# Patient Record
Sex: Female | Born: 2000 | Race: White | Hispanic: No | Marital: Single | Smoking: Never smoker
Health system: Southern US, Community
[De-identification: ages and names within clinical notes are randomized; demographics above are authoritative.]

---
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
## 2019-02-18 ENCOUNTER — Other Ambulatory Visit: Payer: Self-pay | Admitting: Family Medicine

## 2019-02-20 NOTE — Telephone Encounter (Signed)
May have 6 months I recommend schedule a wellness checkup with Victoria Huynh later this year

## 2019-05-14 ENCOUNTER — Other Ambulatory Visit: Payer: Self-pay | Admitting: Family Medicine

## 2019-05-15 NOTE — Telephone Encounter (Signed)
Please contact pt to set up physical, then may route back to nurses. Thank you

## 2019-05-16 NOTE — Telephone Encounter (Signed)
lvm to call and schedule physical

## 2019-05-16 NOTE — Telephone Encounter (Signed)
Tried to call pt mailbox is full.

## 2019-05-17 NOTE — Telephone Encounter (Signed)
Scheduled 12/15 

## 2019-06-20 ENCOUNTER — Other Ambulatory Visit: Payer: Self-pay

## 2019-06-20 ENCOUNTER — Ambulatory Visit (INDEPENDENT_AMBULATORY_CARE_PROVIDER_SITE_OTHER): Payer: BC Managed Care – PPO | Admitting: Family Medicine

## 2019-06-20 VITALS — BP 106/72 | Temp 98.1°F | Ht 63.0 in | Wt 147.4 lb

## 2019-06-20 DIAGNOSIS — N946 Dysmenorrhea, unspecified: Secondary | ICD-10-CM

## 2019-06-20 DIAGNOSIS — Z0000 Encounter for general adult medical examination without abnormal findings: Secondary | ICD-10-CM

## 2019-06-20 NOTE — Progress Notes (Signed)
Subjective:    Patient ID: Victoria Huynh, female    DOB: [birth year], 18 y.o.   MRN: [id number]  HPI The patient comes in today for a wellness visit.  Patient does not smoke does not drink does not do drugs denies being depressed goes to school at [employer] thinking about pharmacy but is looking at the possibility of doing something different because organic chemistry very difficult  A review of their health history was completed.  A review of medications was also completed.  Any needed refills; patient would like to change BCP  Eating habits: could be better but not bad  Falls/  MVA accidents in past few months: none  Regular exercise: not too much except work  Barrister's clerk pt sees on regular basis: none  Preventative health issues were discussed.   Additional concerns: Patient would like to switch to different birth control pill. The current pill she has been on 1.5 years and has started having difficult periods again.    Review of Systems  Constitutional: Negative for activity change, appetite change and fatigue.  HENT: Negative for congestion and rhinorrhea.   Eyes: Negative for discharge.  Respiratory: Negative for cough, chest tightness and wheezing.   Cardiovascular: Negative for chest pain.  Gastrointestinal: Negative for abdominal pain, blood in stool and vomiting.  Endocrine: Negative for polyphagia.  Genitourinary: Negative for difficulty urinating and frequency.  Musculoskeletal: Negative for neck pain.  Skin: Negative for color change.  Allergic/Immunologic: Negative for environmental allergies and food allergies.  Neurological: Negative for weakness and headaches.  Psychiatric/Behavioral: Negative for agitation and behavioral problems.       Objective:   Physical Exam Constitutional:      Appearance: She is well-developed.  HENT:     Head: Normocephalic.     Right Ear: External ear normal.     Left Ear: External ear normal.  Eyes:   Pupils: Pupils are equal, round, and reactive to light.  Neck:     Thyroid: No thyromegaly.  Cardiovascular:     Rate and Rhythm: Normal rate and regular rhythm.     Heart sounds: Normal heart sounds. No murmur.  Pulmonary:     Effort: Pulmonary effort is normal. No respiratory distress.     Breath sounds: Normal breath sounds. No wheezing.  Abdominal:     General: Bowel sounds are normal. There is no distension.     Palpations: Abdomen is soft. There is no mass.     Tenderness: There is no abdominal tenderness.  Musculoskeletal:        General: No tenderness. Normal range of motion.     Cervical back: Normal range of motion.  Lymphadenopathy:     Cervical: No cervical adenopathy.  Skin:    General: Skin is warm and dry.  Neurological:     Mental Status: She is alert and oriented to person, place, and time.     Motor: No abnormal muscle tone.  Psychiatric:        Behavior: Behavior normal.     Healthy diet recommended to the patient safety recommendations as well      Assessment & Plan:  This young patient was seen today for a wellness exam. Significant time was spent discussing the following items: -Developmental status for age was reviewed.  -Safety measures appropriate for age were discussed. -Review of immunizations was completed. The appropriate immunizations were discussed and ordered. -Dietary recommendations and physical activity recommendations were made. -Gen. health recommendations were reviewed -Discussion of growth parameters  were also made with the family. -Questions regarding general health of the patient asked by the family were answered.  Patient defers on HPV vaccine patient defers on flu vaccine  Will discuss other birth control pills with Hoyle Sauer and then discussed with the patient  I did discuss the case with Chrys Racer regarding what pills may work best for this patient she recommended increasing the estrogen component So we will do Loestrin Fe  1.5/30, 1 pack, 12 refills Patient will give [place] feedback over the course of the next 3 months on how that is doing for her

## 2019-07-04 NOTE — Progress Notes (Signed)
Left message to return call- med pended await pharmacy

## 2019-07-04 NOTE — Addendum Note (Signed)
Addended by: Dairl Ponder on: 07/04/2019 11:54 AM   Modules accepted: Orders

## 2019-07-10 ENCOUNTER — Telehealth: Payer: Self-pay | Admitting: Family Medicine

## 2019-07-10 MED ORDER — NORETHIN ACE-ETH ESTRAD-FE 1.5-30 MG-MCG PO TABS: 1.0000 | ORAL_TABLET | Freq: Every day | ORAL | 12 refills | Status: DC

## 2019-07-10 NOTE — Progress Notes (Signed)
Pt contacted and verbalized understanding. Medication sent to pharmacy  

## 2019-07-10 NOTE — Addendum Note (Signed)
Addended by: Marlowe Shores on: 07/10/2019 11:15 AM   Modules accepted: Orders

## 2019-07-10 NOTE — Telephone Encounter (Signed)
Pt contacted and verbalized understanding. Medication sent to pharmacy  

## 2019-07-10 NOTE — Telephone Encounter (Signed)
Pt's mom calling checking to see if Dr. Lorin Picket is going to change pt's birth control. Pt had physical and Dr. Lorin Picket told her he would get with Eber Jones and see about making a change. She is almost out of medication and just wanting to follow up on that.

## 2019-08-06 ENCOUNTER — Other Ambulatory Visit: Payer: Self-pay | Admitting: Family Medicine

## 2020-06-10 ENCOUNTER — Other Ambulatory Visit: Payer: Self-pay | Admitting: Family Medicine

## 2020-06-10 NOTE — Telephone Encounter (Signed)
No answer

## 2020-06-13 NOTE — Telephone Encounter (Signed)
Has appointment for on 12/14

## 2020-06-13 NOTE — Telephone Encounter (Signed)
Patient schedule appointment for 12/14

## 2020-06-18 ENCOUNTER — Ambulatory Visit (INDEPENDENT_AMBULATORY_CARE_PROVIDER_SITE_OTHER): Payer: BC Managed Care – PPO | Admitting: Family Medicine

## 2020-06-18 ENCOUNTER — Encounter: Payer: Self-pay | Admitting: Family Medicine

## 2020-06-18 VITALS — BP 110/62 | HR 76 | Temp 97.2°F | Ht 63.0 in | Wt 125.6 lb

## 2020-06-18 DIAGNOSIS — Z3041 Encounter for surveillance of contraceptive pills: Secondary | ICD-10-CM

## 2020-06-18 MED ORDER — NORETHINDRONE ACET-ETHINYL EST 1.5-30 MG-MCG PO TABS: 1.0000 | ORAL_TABLET | Freq: Every day | ORAL | 11 refills | Status: DC

## 2020-06-18 NOTE — Patient Instructions (Signed)

## 2020-06-18 NOTE — Progress Notes (Signed)
   Patient ID: Victoria Huynh, female    DOB: [birth year], 19 y.o.   MRN: [id number]   Chief Complaint  Patient presents with  . Contraception    Follow up- needs refill of birth control- doing well on current birth control   Subjective:  CC: oral contraceptive refills   Presents today for oral contraceptive medication management.  She is due for her annual wellness, unable to make that appointment, will make that soon denies any issues with oral contraceptives, managing her menstrual cycle well with a shorter and lighter flow and manageable cramps.  Denies any fever, chills, chest pain, shortness of breath, abdominal pain, no leg pain.    Medical History Dejon has no past medical history on file.   Outpatient Encounter Medications as of 06/18/2020  Medication Sig  . [DISCONTINUED] LO LOESTRIN FE 1 MG-10 MCG / 10 MCG tablet TAKE 1 TABLET BY MOUTH EVERY DAY  . Norethindrone Acetate-Ethinyl Estradiol (JUNEL 1.5/30) 1.5-30 MG-MCG tablet Take 1 tablet by mouth daily.  . [DISCONTINUED] Norethindrone Acetate-Ethinyl Estradiol (JUNEL 1.5/30) 1.5-30 MG-MCG tablet Take 1 tablet by mouth daily.  . [DISCONTINUED] norethindrone-ethinyl estradiol-iron (LOESTRIN FE 1.5/30) 1.5-30 MG-MCG tablet Take 1 tablet by mouth daily.   No facility-administered encounter medications on file as of 06/18/2020.     Review of Systems  Constitutional: Negative for chills and fever.  HENT: Negative for ear pain.   Respiratory: Negative for shortness of breath.   Cardiovascular: Negative for chest pain.  Gastrointestinal: Negative for abdominal pain.     Vitals BP 110/62   Pulse 76   Temp (!) 97.2 F (36.2 C) (Oral)   Ht 5\' 3"  (1.6 m)   Wt 125 lb 9.6 oz (57 kg)   SpO2 100%   BMI 22.25 kg/m   Objective:   Physical Exam Vitals reviewed.  Constitutional:      Appearance: Normal appearance.  Cardiovascular:     Rate and Rhythm: Normal rate and regular rhythm.     Heart sounds: Normal heart sounds.   Pulmonary:     Effort: Pulmonary effort is normal.     Breath sounds: Normal breath sounds.  Abdominal:     General: Bowel sounds are normal.  Skin:    General: Skin is warm and dry.  Neurological:     General: No focal deficit present.     Mental Status: She is alert.  Psychiatric:        Behavior: Behavior normal.      Assessment and Plan   1. Oral contraceptive pill surveillance - Norethindrone Acetate-Ethinyl Estradiol (JUNEL 1.5/30) 1.5-30 MG-MCG tablet; Take 1 tablet by mouth daily.  Dispense: 28 tablet; Refill: 11   No problems with OCPs. Managing her menstrual cycle problems well. Menstrual cycles are shorter, with lighter flow, she has some cramping but is easily managed she is able to continue with her daily routine.  Denies any issues with leg pain.  Is not sexually active, has not missed any pills  .Agrees with plan of care discussed today. Understands warning signs to seek further care: Chest pain, shortness of breath, any lower leg pain. Understands to follow-up for wellness exam soon, will make that appointment on the way out today.  05-09-1978, FNP-C

## 2020-07-04 ENCOUNTER — Other Ambulatory Visit: Payer: Self-pay

## 2020-07-04 ENCOUNTER — Encounter: Payer: Self-pay | Admitting: Family Medicine

## 2020-07-04 ENCOUNTER — Ambulatory Visit (INDEPENDENT_AMBULATORY_CARE_PROVIDER_SITE_OTHER): Payer: BC Managed Care – PPO | Admitting: Family Medicine

## 2020-07-04 VITALS — BP 110/72 | HR 79 | Temp 98.1°F | Ht 64.0 in | Wt 126.0 lb

## 2020-07-04 DIAGNOSIS — T7840XA Allergy, unspecified, initial encounter: Secondary | ICD-10-CM

## 2020-07-04 DIAGNOSIS — Z0000 Encounter for general adult medical examination without abnormal findings: Secondary | ICD-10-CM

## 2020-07-04 MED ORDER — EPINEPHRINE 0.3 MG/0.3ML IJ SOAJ: 0.3000 mg | INTRAMUSCULAR | 2 refills | Status: DC | PRN

## 2020-07-04 NOTE — Progress Notes (Signed)
Patient ID: Victoria Huynh, female    DOB: 10-Jan-2001, 19 y.o.   MRN: [id number]   Chief Complaint  Patient presents with  . Annual Exam   Subjective:  CC: annual wellness  Presents today for annual wellness.  Was last seen in December for oral contraceptive management, presents today for her annual physical.  Reports that on December 23 she broke out in hives, she was at work, she works at [employer] is unsure of what caused this allergic reaction.  The hives went up her neck, down her back, and had profuse itching.  She did take Benadryl, which helped relieve the discomfort, hives were present for approximately 3 days.  She denies any other issues, no shortness of breath during the reaction.    The patient comes in today for a wellness visit.    A review of their health history was completed.  A review of medications was also completed.  Any needed refills; none  Eating habits: health conscious  Falls/  MVA accidents in past few months: none  Regular exercise: softball 2 -3 times a week  Specialist pt sees on regular basis: none  Preventative health issues were discussed.   Additional concerns: broke out in hives on back and neck on dec 23rd.    Medical History Victoria Huynh has no past medical history on file.   Outpatient Encounter Medications as of 07/04/2020  Medication Sig  . EPINEPHrine 0.3 mg/0.3 mL IJ SOAJ injection Inject 0.3 mg into the muscle as needed for anaphylaxis.  . Norethindrone Acetate-Ethinyl Estradiol (JUNEL 1.5/30) 1.5-30 MG-MCG tablet Take 1 tablet by mouth daily.   No facility-administered encounter medications on file as of 07/04/2020.     Review of Systems  Constitutional: Negative for chills, fatigue and fever.  HENT: Negative for congestion.   Respiratory: Negative for cough and shortness of breath.   Cardiovascular: Negative for chest pain, palpitations and leg swelling.  Gastrointestinal: Negative for abdominal pain, blood in stool,  diarrhea, nausea and vomiting.  Genitourinary: Negative for dysuria, menstrual problem, pelvic pain, vaginal bleeding and vaginal discharge.  Musculoskeletal: Negative for back pain, joint swelling and neck pain.  Skin: Negative for rash.       Hives and itching on Dec 23 (unknown cause).  Neurological: Negative for dizziness, weakness, light-headedness, numbness and headaches.  Hematological: Negative for adenopathy.  Psychiatric/Behavioral: Negative for self-injury and sleep disturbance. The patient is not nervous/anxious.      Vitals BP 110/72   Pulse 79   Temp 98.1 F (36.7 C)   Ht 5\' 4"  (1.626 m)   Wt 126 lb (57.2 kg)   LMP 06/13/2020 (Approximate)   SpO2 100%   BMI 21.63 kg/m   Objective:   Physical Exam Vitals reviewed.  Constitutional:      Appearance: Normal appearance.  HENT:     Right Ear: Tympanic membrane normal.     Left Ear: Tympanic membrane normal.     Nose: Nose normal.     Mouth/Throat:     Mouth: Mucous membranes are moist.     Pharynx: No oropharyngeal exudate or posterior oropharyngeal erythema.     Tonsils: 1+ on the right. 2+ on the left.     Comments: Denies sore throat. Eyes:     Extraocular Movements: Extraocular movements intact.     Pupils: Pupils are equal, round, and reactive to light.  Cardiovascular:     Rate and Rhythm: Normal rate and regular rhythm.     Heart sounds: Normal  heart sounds.  Pulmonary:     Effort: Pulmonary effort is normal.     Breath sounds: Normal breath sounds.  Chest:  Breasts:     Right: Normal. No mass, skin change, axillary adenopathy or supraclavicular adenopathy.     Left: Normal. No mass, skin change, axillary adenopathy or supraclavicular adenopathy.    Abdominal:     General: Bowel sounds are normal.     Tenderness: There is no abdominal tenderness.  Musculoskeletal:        General: Normal range of motion.  Lymphadenopathy:     Upper Body:     Right upper body: No supraclavicular or axillary  adenopathy.     Left upper body: No supraclavicular or axillary adenopathy.  Skin:    General: Skin is warm and dry.  Neurological:     General: No focal deficit present.     Mental Status: She is alert.  Psychiatric:        Behavior: Behavior normal.     Wellness  Safety measures appropriate for age discussed: Immunizations reviewed: flu and Covid vaccine discussed.  Encouraged Covid vaccine. Diet and exercise/ lifestyle modifications discussed: Endorses healthy lifestyle.  BMI a healthy range. Stress management discussed: Denies stress in her life. Routine vision and dental screening discussed: dental every 6 months, recommend baselline vision screening.  Health maintenance: pap due age 78.  Questions answered.   Assessment and Plan   1. Routine general medical examination at a health care facility  2. Allergic reaction, initial encounter - EPINEPHrine 0.3 mg/0.3 mL IJ SOAJ injection; Inject 0.3 mg into the muscle as needed for anaphylaxis.  Dispense: 1 each; Refill: 2   Concerning allergic reaction with hives.  Unknown trigger.  Will prescribe epinephrine pen, instructed that if she needed to use this, she would then need to call 911.  Instructed her to also have the pharmacist show her how to use the pen that she is given.  Agrees with plan of care discussed today. Understands warning signs to seek further care: Chest pain, shortness of breath, any significant change in health. Understands to follow-up in 1 year for annual physical, sooner if needed.   Dorena Bodo, FNP-C  07/04/2020

## 2020-07-04 NOTE — Patient Instructions (Signed)
Preventive Care 21-20 Years Old, Female Preventive care refers to visits with your health care provider and lifestyle choices that can promote health and wellness. This includes:  A yearly physical exam. This may also be called an annual well check.  Regular dental visits and eye exams.  Immunizations.  Screening for certain conditions.  Healthy lifestyle choices, such as eating a healthy diet, getting regular exercise, not using drugs or products that contain nicotine and tobacco, and limiting alcohol use. What can I expect for my preventive care visit? Physical exam Your health care provider will check your:  Height and weight. This may be used to calculate body mass index (BMI), which tells if you are at a healthy weight.  Heart rate and blood pressure.  Skin for abnormal spots. Counseling Your health care provider may ask you questions about your:  Alcohol, tobacco, and drug use.  Emotional well-being.  Home and relationship well-being.  Sexual activity.  Eating habits.  Work and work environment.  Method of birth control.  Menstrual cycle.  Pregnancy history. What immunizations do I need?  Influenza (flu) vaccine  This is recommended every year. Tetanus, diphtheria, and pertussis (Tdap) vaccine  You may need a Td booster every 10 years. Varicella (chickenpox) vaccine  You may need this if you have not been vaccinated. Human papillomavirus (HPV) vaccine  If recommended by your health care provider, you may need three doses over 6 months. Measles, mumps, and rubella (MMR) vaccine  You may need at least one dose of MMR. You may also need a second dose. Meningococcal conjugate (MenACWY) vaccine  One dose is recommended if you are age 19-21 years and a first-year college student living in a residence hall, or if you have one of several medical conditions. You may also need additional booster doses. Pneumococcal conjugate (PCV13) vaccine  You may need  this if you have certain conditions and were not previously vaccinated. Pneumococcal polysaccharide (PPSV23) vaccine  You may need one or two doses if you smoke cigarettes or if you have certain conditions. Hepatitis A vaccine  You may need this if you have certain conditions or if you travel or work in places where you may be exposed to hepatitis A. Hepatitis B vaccine  You may need this if you have certain conditions or if you travel or work in places where you may be exposed to hepatitis B. Haemophilus influenzae type b (Hib) vaccine  You may need this if you have certain conditions. You may receive vaccines as individual doses or as more than one vaccine together in one shot (combination vaccines). Talk with your health care provider about the risks and benefits of combination vaccines. What tests do I need?  Blood tests  Lipid and cholesterol levels. These may be checked every 5 years starting at age 20.  Hepatitis C test.  Hepatitis B test. Screening  Diabetes screening. This is done by checking your blood sugar (glucose) after you have not eaten for a while (fasting).  Sexually transmitted disease (STD) testing.  BRCA-related cancer screening. This may be done if you have a family history of breast, ovarian, tubal, or peritoneal cancers.  Pelvic exam and Pap test. This may be done every 3 years starting at age 21. Starting at age 30, this may be done every 5 years if you have a Pap test in combination with an HPV test. Talk with your health care provider about your test results, treatment options, and if necessary, the need for more tests.   Follow these instructions at home: Eating and drinking   Eat a diet that includes fresh fruits and vegetables, whole grains, lean protein, and low-fat dairy.  Take vitamin and mineral supplements as recommended by your health care provider.  Do not drink alcohol if: ? Your health care provider tells you not to drink. ? You are  pregnant, may be pregnant, or are planning to become pregnant.  If you drink alcohol: ? Limit how much you have to 0-1 drink a day. ? Be aware of how much alcohol is in your drink. In the [place] one drink equals one 12 oz bottle of beer (355 mL), one 5 oz glass of wine (148 mL), or one 1 oz glass of hard liquor (44 mL). Lifestyle  Take daily care of your teeth and gums.  Stay active. Exercise for at least 30 minutes on 5 or more days each week.  Do not use any products that contain nicotine or tobacco, such as cigarettes, e-cigarettes, and chewing tobacco. If you need help quitting, ask your health care provider.  If you are sexually active, practice safe sex. Use a condom or other form of birth control (contraception) in order to prevent pregnancy and STIs (sexually transmitted infections). If you plan to become pregnant, see your health care provider for a preconception visit. What's next?  Visit your health care provider once a year for a well check visit.  Ask your health care provider how often you should have your eyes and teeth checked.  Stay up to date on all vaccines. This information is not intended to replace advice given to you by your health care provider. Make sure you discuss any questions you have with your health care provider. Document Revised: 03/03/2018 Document Reviewed: 03/03/2018 Elsevier Patient Education  2020 [employer].

## 2020-07-18 ENCOUNTER — Other Ambulatory Visit: Payer: BC Managed Care – PPO

## 2020-07-18 DIAGNOSIS — Z20822 Contact with and (suspected) exposure to covid-19: Secondary | ICD-10-CM

## 2020-07-21 LAB — NOVEL CORONAVIRUS, NAA: SARS-CoV-2, NAA: NOT DETECTED

## 2021-06-10 ENCOUNTER — Other Ambulatory Visit: Payer: Self-pay

## 2021-06-10 ENCOUNTER — Ambulatory Visit (INDEPENDENT_AMBULATORY_CARE_PROVIDER_SITE_OTHER): Payer: BC Managed Care – PPO | Admitting: Family Medicine

## 2021-06-10 ENCOUNTER — Encounter: Payer: Self-pay | Admitting: Family Medicine

## 2021-06-10 VITALS — BP 122/76 | HR 65 | Temp 97.2°F | Ht 64.0 in | Wt 130.0 lb

## 2021-06-10 DIAGNOSIS — Z3041 Encounter for surveillance of contraceptive pills: Secondary | ICD-10-CM

## 2021-06-10 DIAGNOSIS — Z114 Encounter for screening for human immunodeficiency virus [HIV]: Secondary | ICD-10-CM

## 2021-06-10 DIAGNOSIS — Z1159 Encounter for screening for other viral diseases: Secondary | ICD-10-CM

## 2021-06-10 MED ORDER — NORETHINDRONE ACET-ETHINYL EST 1.5-30 MG-MCG PO TABS: 1.0000 | ORAL_TABLET | Freq: Every day | ORAL | 11 refills | Status: DC

## 2021-06-10 NOTE — Progress Notes (Signed)
   Subjective:    Patient ID: Victoria Huynh, female    DOB: [birth year], 20 y.o.   MRN: [id number]  HPI The patient comes in today for a wellness visit. Patient is in college currently Does not smoke  Has occasional alcohol Not depressed A lot of stress at school but managing well     A review of their health history was completed.  A review of medications was also completed.  Any needed refills; birth control pills Sexually active Eating habits: well balanced  Falls/  MVA accidents in past few months: no  Regular exercise: softball lpractice  Specialist pt sees on regular basis: no  Preventative health issues were discussed.   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 Oral contraceptive pill surveillance Refills given today Recommend Pap smear with Hillary Bow in the near future - Norethindrone Acetate-Ethinyl Estradiol (JUNEL 1.5/30) 1.5-30 MG-MCG tablet; Take 1 tablet by mouth daily.  Dispense: 28 tablet; Refill: 11 - Hepatitis C antibody - HIV Antibody (routine testing w rflx) - Lipid panel  2. Encounter for screening for HIV Screening - HIV Antibody (routine testing w rflx)  3. Encounter for hepatitis C screening test for low risk patient Screening - Hepatitis C antibody  Adult wellness-complete.wellness physical was conducted today. Importance of diet and exercise were discussed in detail.  In addition to this a discussion regarding safety was also covered. We also reviewed over immunizations and gave recommendations regarding current immunization needed for age.  In addition to this additional areas were also touched on  including: Preventative health exams needed:  Colonoscopy not indicated  Patient was advised yearly wellness exam HPV was discussed information given patient will consider

## 2021-06-10 NOTE — Patient Instructions (Addendum)
You may set up a Pap smear with Hillary Bow or nurse practitioner at your convenience.  When you are ready simply call our office and talk with the lady upfront about setting up a Pap smear  If you decide to do the HPV vaccine please let us know and we can help guide you at getting it completed as part of nurse visits here.  It would be wise to do blood work as part of annual wellness you can do this at your convenience anywhere within the next 30 to 60 days.  Good luck on your finals, projects, presentation [facility] Dr. Lorin Picket

## 2021-06-17 ENCOUNTER — Telehealth: Payer: Self-pay | Admitting: Family Medicine

## 2021-06-17 NOTE — Telephone Encounter (Signed)
Please advise. Thank you

## 2021-06-17 NOTE — Telephone Encounter (Signed)
Patient wants birth control that include iron in it the one sent in was wrong please advise. [facility]

## 2021-06-18 ENCOUNTER — Other Ambulatory Visit: Payer: Self-pay | Admitting: Family Medicine

## 2021-06-18 MED ORDER — NORETHIN ACE-ETH ESTRAD-FE 1.5-30 MG-MCG PO TABS: 1.0000 | ORAL_TABLET | Freq: Every day | ORAL | 12 refills | Status: DC

## 2021-06-18 NOTE — Telephone Encounter (Signed)
Pt contacted and verbalized understanding.  

## 2021-06-18 NOTE — Telephone Encounter (Signed)
1 with iron was sent in as requested

## 2021-08-15 DIAGNOSIS — Z1159 Encounter for screening for other viral diseases: Secondary | ICD-10-CM | POA: Diagnosis not present

## 2021-08-15 DIAGNOSIS — R69 Illness, unspecified: Secondary | ICD-10-CM | POA: Diagnosis not present

## 2021-08-15 DIAGNOSIS — Z3041 Encounter for surveillance of contraceptive pills: Secondary | ICD-10-CM | POA: Diagnosis not present

## 2021-08-16 LAB — LIPID PANEL
Chol/HDL Ratio: 2.3 ratio (ref 0.0–4.4)
Cholesterol, Total: 147 mg/dL (ref 100–199)
HDL: 63 mg/dL (ref >39)
LDL Chol Calc (NIH): 71 mg/dL (ref 0–99)
Triglycerides: 66 mg/dL (ref 0–149)
VLDL Cholesterol Cal: 13 mg/dL (ref 5–40)

## 2021-08-16 LAB — HEPATITIS C ANTIBODY: Hep C Virus Ab: <0.1 s/co ratio (ref 0.0–0.9)

## 2021-08-16 LAB — HIV ANTIBODY (ROUTINE TESTING W REFLEX): HIV Screen 4th Generation wRfx: NONREACTIVE

## 2021-09-10 ENCOUNTER — Other Ambulatory Visit: Payer: Self-pay

## 2021-09-10 ENCOUNTER — Ambulatory Visit (INDEPENDENT_AMBULATORY_CARE_PROVIDER_SITE_OTHER): Payer: BC Managed Care – PPO | Admitting: Nurse Practitioner

## 2021-09-10 ENCOUNTER — Encounter: Payer: Self-pay | Admitting: Nurse Practitioner

## 2021-09-10 VITALS — BP 118/82 | HR 92 | Temp 98.7°F | Ht 64.0 in | Wt 142.2 lb

## 2021-09-10 DIAGNOSIS — Z124 Encounter for screening for malignant neoplasm of cervix: Secondary | ICD-10-CM

## 2021-09-10 DIAGNOSIS — Z01419 Encounter for gynecological examination (general) (routine) without abnormal findings: Secondary | ICD-10-CM

## 2021-09-10 NOTE — Progress Notes (Signed)
? ?Subjective:  ? ? Patient ID: Victoria Huynh, female    DOB: [birth year], 21 y.o.   MRN: [id number] ? ?HPI ? ?Patient arrives for GYN Exam and PAP Smear. Patient had complete physical and lab work 06/10/2021.  ? ?Patient currently on birth control has no concerns.  Patient has had the same partner for 8 years. ? ?Patient has no complaints. ? ?Review of Systems  ?All other systems reviewed and are negative. ? ?   ?Objective:  ? Physical Exam ?Exam conducted with a chaperone present.  ?Constitutional:   ?   General: She is not in acute distress. ?   Appearance: Normal appearance. She is normal weight. She is not ill-appearing or toxic-appearing.  ?Cardiovascular:  ?   Rate and Rhythm: Normal rate and regular rhythm.  ?   Pulses: Normal pulses.  ?   Heart sounds: Normal heart sounds. No murmur heard. ?Pulmonary:  ?   Effort: Pulmonary effort is normal. No respiratory distress.  ?   Breath sounds: Normal breath sounds. No wheezing.  ?Chest:  ?   Chest wall: No mass, lacerations, deformity, swelling, tenderness, crepitus or edema. There is no dullness to percussion.  ?Breasts: ?   Breasts are symmetrical.  ?   Right: Normal. No swelling, bleeding, inverted nipple, mass, nipple discharge, skin change or tenderness.  ?   Left: Normal. No swelling, bleeding (.bdragon), inverted nipple, mass, nipple discharge, skin change or tenderness.  ?Abdominal:  ?   Hernia: There is no hernia in the left inguinal area or right inguinal area.  ?Genitourinary: ?   General: Normal vulva.  ?   Exam position: Lithotomy position.  ?   Pubic Area: No rash or pubic lice.   ?   Labia:     ?   Right: No rash, tenderness, lesion or injury.     ?   Left: No rash, tenderness, lesion or injury.   ?   Urethra: No prolapse, urethral pain, urethral swelling or urethral lesion.  ?   Vagina: Normal. No signs of injury and foreign body. No vaginal discharge, erythema, tenderness, bleeding, lesions or prolapsed vaginal walls.  ?   Cervix: Normal and dilated.  No eversion.  ?   Uterus: Normal. Not deviated, not enlarged, not fixed, not tender and no uterine prolapse.   ?   Adnexa: Right adnexa normal and left adnexa normal.    ?   Right: No mass, tenderness or fullness.      ?   Left: No mass, tenderness or fullness.    ?   Rectum: Normal.  ?   Comments: Mild ectropion noted around cervical os.  Mildly friable.  Patient on combined oral contraceptive. ?Musculoskeletal:     ?   General: Normal range of motion.  ?Lymphadenopathy:  ?   Upper Body:  ?   Right upper body: No supraclavicular, axillary or pectoral adenopathy.  ?   Left upper body: No supraclavicular, axillary or pectoral adenopathy.  ?   Lower Body: No right inguinal adenopathy. No left inguinal adenopathy.  ?Skin: ?   General: Skin is warm.  ?Neurological:  ?   General: No focal deficit present.  ?   Mental Status: She is alert and oriented to person, place, and time.  ?Psychiatric:     ?   Mood and Affect: Mood normal.     ?   Behavior: Behavior normal.  ? ? ? ? ? ?   ?Assessment & Plan:  ?1. Women's annual  routine gynecological examination ?-Patient currently taking birth control without difficulty. ?-STD risk and importance of getting STD testing in detail. ?-Patient declines STD testing today stating that she has been with the same partner for about 8 years. ?-Mammogram not indicated due to age ?-Return to clinic in approximately 1 year for yearly exam. ? ?2. Pap smear for cervical cancer screening ?-Pap smear conducted today. ?-Awaiting results. ?-If Pap within normal limits next Pap will be due in 2026. ? ?  ?Note:  This document was prepared using Dragon voice recognition software and may include unintentional dictation errors. ? ? ? ?

## 2021-09-10 NOTE — Addendum Note (Signed)
Addended by: Margaretha Sheffield on: 09/10/2021 10:18 AM ? ? Modules accepted: Orders ? ?

## 2021-09-13 LAB — SPECIMEN STATUS REPORT

## 2021-09-13 LAB — IGP, APTIMA HPV: HPV Aptima: NEGATIVE

## 2022-06-05 ENCOUNTER — Encounter: Payer: Self-pay | Admitting: Nurse Practitioner

## 2022-06-05 ENCOUNTER — Ambulatory Visit (INDEPENDENT_AMBULATORY_CARE_PROVIDER_SITE_OTHER): Payer: 59 | Admitting: Nurse Practitioner

## 2022-06-05 ENCOUNTER — Ambulatory Visit: Payer: BC Managed Care – PPO | Admitting: Family Medicine

## 2022-06-05 VITALS — BP 113/72 | HR 81 | Wt 178.0 lb

## 2022-06-05 DIAGNOSIS — Z3041 Encounter for surveillance of contraceptive pills: Secondary | ICD-10-CM | POA: Diagnosis not present

## 2022-06-05 DIAGNOSIS — N920 Excessive and frequent menstruation with regular cycle: Secondary | ICD-10-CM

## 2022-06-05 DIAGNOSIS — N946 Dysmenorrhea, unspecified: Secondary | ICD-10-CM

## 2022-06-05 MED ORDER — NORETHIN ACE-ETH ESTRAD-FE 1.5-30 MG-MCG PO TABS: 1.0000 | ORAL_TABLET | Freq: Every day | ORAL | 3 refills | Status: DC

## 2022-06-05 NOTE — Progress Notes (Addendum)
   Subjective:    Patient ID: Victoria Huynh, female    DOB: May 09, 2001, 21 y.o.   MRN: [id number]  HPI Pt arrives to follow up on birth control. Pt states doing well. No issues at this time.  Cramping during cycles has improved. Shorter, lighter cycles lasting 3-4 days. Same female sexual partner. Had PE and normal PAP in March 2023. Defers STI testing. Denies any missed pills.  Denies tobacco use.  Has had some weight gain since completing college this spring but says she is more comfortable at her current weight. Healthy lifestyle including regular activity at the gym.   Review of Systems  Respiratory:  Negative for chest tightness and shortness of breath.   Cardiovascular:  Negative for chest pain.  Genitourinary:  Negative for menstrual problem.      06/05/2022    8:39 AM  Depression screen PHQ 2/9  Decreased Interest 0  Down, Depressed, Hopeless 0  PHQ - 2 Score 0        Objective:   Physical Exam NAD. Alert, oriented. Lungs clear. Heart RRR.  Today's Vitals   06/05/22 0839  BP: 113/72  Pulse: 81  SpO2: 97%  Weight: 178 lb (80.7 kg)   Body mass index is 30.55 kg/m.        Assessment & Plan:   Problem List Items Addressed This Visit       Genitourinary   Dysmenorrhea     Other   Menorrhagia with regular cycle   Oral contraceptive pill surveillance - Primary   Meds ordered this encounter  Medications   norethindrone-ethinyl estradiol-iron (LOESTRIN FE 1.5/30) 1.5-30 MG-MCG tablet    Sig: Take 1 tablet by mouth daily.    Dispense:  84 tablet    Refill:  3    She prefers 1 with iron    Order Specific Question:   Supervising Provider    Answer:   Lilyan Punt A [[id number]]  Continue current regimen. Contact office if any problems such as heavy or prolonged bleeding.  Return in about 1 year (around 06/06/2023) for physical.

## 2023-05-06 ENCOUNTER — Ambulatory Visit (INDEPENDENT_AMBULATORY_CARE_PROVIDER_SITE_OTHER): Payer: 59 | Admitting: Nurse Practitioner

## 2023-05-06 VITALS — BP 118/78 | HR 75 | Temp 98.8°F | Ht 64.0 in | Wt 161.6 lb

## 2023-05-06 DIAGNOSIS — Z01419 Encounter for gynecological examination (general) (routine) without abnormal findings: Secondary | ICD-10-CM | POA: Diagnosis not present

## 2023-05-06 DIAGNOSIS — Z3041 Encounter for surveillance of contraceptive pills: Secondary | ICD-10-CM | POA: Diagnosis not present

## 2023-05-06 MED ORDER — NORETHIN ACE-ETH ESTRAD-FE 1.5-30 MG-MCG PO TABS: 1.0000 | ORAL_TABLET | Freq: Every day | ORAL | 3 refills | Status: DC

## 2023-05-06 NOTE — Progress Notes (Signed)
Subjective:    Patient ID: Victoria Huynh, female    DOB: [birth year], 22 y.o.   MRN: [id number]  HPI The patient comes in today for a wellness visit.    A review of their health history was completed.  A review of medications was also completed.  Any needed refills; Birth Control  Eating habits: Good; eating fast food more often due to schedule  Falls/  MVA accidents in past few months: No  Regular exercise: Yes; very active job with standing and walking   Specialist pt sees on regular basis: No  Preventative health issues were discussed.   Additional concerns: Fatigue during period week when taking sugar pill sin birth control pack; for the past couple of months has noticed more fatigue at the beginning of her period for about 3-4 days. Regular cycles with regular flow. No cramps. That is the only issue with the pill.  Same female sexual partner for the past 9 years; defers STD testing.  Due for a dental exam.  Working on her masters degree in psychology.    Review of Systems  Constitutional:  Positive for fatigue. Negative for activity change and appetite change.  HENT:  Negative for sore throat and trouble swallowing.   Respiratory:  Negative for cough, chest tightness, shortness of breath and wheezing.   Cardiovascular:  Negative for chest pain.  Gastrointestinal:  Negative for abdominal distention, abdominal pain, constipation, diarrhea, nausea and vomiting.  Genitourinary:  Negative for difficulty urinating, dysuria, enuresis, frequency, genital sores, menstrual problem, pelvic pain, urgency and vaginal discharge.      05/06/2023    9:46 AM  Depression screen PHQ 2/9  Decreased Interest 0  Down, Depressed, Hopeless 0  PHQ - 2 Score 0  Altered sleeping 0  Tired, decreased energy 0  Change in appetite 0  Feeling bad or failure about yourself  0  Trouble concentrating 0  Moving slowly or fidgety/restless 0  Suicidal thoughts 0  PHQ-9 Score 0  Difficult doing  work/chores Not difficult at all      05/06/2023    9:46 AM  GAD 7 : Generalized Anxiety Score  Nervous, Anxious, on Edge 0  Control/stop worrying 0  Worry too much - different things 0  Trouble relaxing 0  Restless 0  Easily annoyed or irritable 0  Afraid - awful might happen 0  Total GAD 7 Score 0  Anxiety Difficulty Not difficult at all         Objective:   Physical Exam Vitals and nursing note reviewed.  Constitutional:      General: She is not in acute distress.    Appearance: She is well-developed.  Neck:     Thyroid: No thyromegaly.     Trachea: No tracheal deviation.     Comments: Thyroid non tender to palpation. No mass or goiter noted.  Cardiovascular:     Rate and Rhythm: Normal rate and regular rhythm.     Heart sounds: Normal heart sounds. No murmur heard. Pulmonary:     Effort: Pulmonary effort is normal.     Breath sounds: Normal breath sounds.  Chest:  Breasts:    Right: No swelling, inverted nipple, mass, skin change or tenderness.     Left: No swelling, inverted nipple, mass, skin change or tenderness.  Abdominal:     General: There is no distension.     Palpations: Abdomen is soft.     Tenderness: There is no abdominal tenderness.  Musculoskeletal:  Cervical back: Normal range of motion and neck supple.  Lymphadenopathy:     Cervical: No cervical adenopathy.     Upper Body:     Right upper body: No supraclavicular, axillary or pectoral adenopathy.     Left upper body: No supraclavicular, axillary or pectoral adenopathy.  Skin:    General: Skin is warm and dry.  Neurological:     Mental Status: She is alert and oriented to person, place, and time.  Psychiatric:        Mood and Affect: Mood normal.        Behavior: Behavior normal.        Thought Content: Thought content normal.        Judgment: Judgment normal.    Today's Vitals   05/06/23 0936  BP: 118/78  Pulse: 75  Temp: 98.8 F (37.1 C)  SpO2: 98%  Weight: 161 lb 9.6 oz  (73.3 kg)  Height: 5\' 4"  (1.626 m)   Body mass index is 27.74 kg/m.        Assessment & Plan:   Problem List Items Addressed This Visit       Other   Oral contraceptive pill surveillance   Other Visit Diagnoses     Well woman exam    -  Primary      Meds ordered this encounter  Medications   norethindrone-ethinyl estradiol-iron (LOESTRIN FE 1.5/30) 1.5-30 MG-MCG tablet    Sig: Take 1 tablet by mouth daily.    Dispense:  84 tablet    Refill:  3    She prefers 1 with iron    Order Specific Question:   Supervising Provider    Answer:   Lilyan Punt A [[id number]]   Continue current oc as directed. Discussed options such as continuous pills or adding a small dose of estrogen during the days of fatigue. Patient will look into options and get back with [place]. Discussed importance of healthy diet and adequate rest.  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 Return in about 1 year (around 05/05/2024) for physical.

## 2023-05-07 ENCOUNTER — Encounter: Payer: Self-pay | Admitting: Nurse Practitioner

## 2024-03-24 ENCOUNTER — Encounter: Payer: Self-pay | Admitting: Nurse Practitioner

## 2024-03-24 ENCOUNTER — Ambulatory Visit (INDEPENDENT_AMBULATORY_CARE_PROVIDER_SITE_OTHER): Admitting: Nurse Practitioner

## 2024-03-24 VITALS — BP 116/73 | HR 78 | Temp 98.6°F | Ht 64.0 in | Wt 160.1 lb

## 2024-03-24 DIAGNOSIS — Z3041 Encounter for surveillance of contraceptive pills: Secondary | ICD-10-CM | POA: Diagnosis not present

## 2024-03-24 DIAGNOSIS — R5383 Other fatigue: Secondary | ICD-10-CM | POA: Diagnosis not present

## 2024-03-24 DIAGNOSIS — L700 Acne vulgaris: Secondary | ICD-10-CM | POA: Diagnosis not present

## 2024-03-24 DIAGNOSIS — Z13220 Encounter for screening for lipoid disorders: Secondary | ICD-10-CM | POA: Diagnosis not present

## 2024-03-24 MED ORDER — CLINDAMYCIN PHOS (TWICE-DAILY) 1 % EX GEL: Freq: Two times a day (BID) | CUTANEOUS | 0 refills | Status: DC

## 2024-03-24 MED ORDER — NORETHIN ACE-ETH ESTRAD-FE 1.5-30 MG-MCG PO TABS: 1.0000 | ORAL_TABLET | Freq: Every day | ORAL | 0 refills | Status: DC

## 2024-03-24 NOTE — Patient Instructions (Signed)
 Acne  Acne is a skin problem that causes pimples and other skin changes. The skin has many tiny openings called pores. Each pore contains an oil gland. Oil glands make an oily substance called sebum. Acne happens when the pores in the skin get blocked. The pores may get infected with bacteria, or they may become red, sore, and swollen.  Acne is a common skin problem, especially for teens. It often forms on your face, neck, chest, upper arms, and back. Acne usually goes away with time. What are the causes? Acne is caused when oil glands get blocked with sebum, dead skin cells, and dirt. The bacteria that are normally found in the oil glands then increase, causing inflammation. Acne is commonly triggered by changes in your hormones. These hormone changes can cause the oil glands to get bigger and to make more sebum. Factors that can make acne worse include: Hormone changes during: Adolescence. Monthly periods (menstrual cycles). Pregnancy. Oil-based makeup, creams, and hair products. Stress. Hormone problems that are caused by certain diseases. Certain medicines. Pressure from headbands, backpacks, or shoulder pads. Being exposed to certain oils and chemicals. Eating a diet high in carbs (carbohydrates) that quickly turn to sugar. These include dairy products, desserts, and chocolates. What increases the risk? You are more likely to get acne if: You are a teen. You have a family history of acne. What are the signs or symptoms? Symptoms of acne include: Small, red bumps (pimples or papules). Whiteheads. Blackheads. Small, pus-filled pimples (pustules). Big, red pimples or pustules that feel tender. More severe acne can cause: Abscesses. These are infected areas that hold a collection of pus. Cysts. These are hard, painful, fluid-filled sacs. Scars. These can form after large pimples heal. How is this diagnosed? Acne is diagnosed with a medical history and physical exam. You may also  have blood tests. How is this treated? Treatment depends on how severe your acne is. Treatment may include: Creams and lotions that: Keep oil glands from clogging. Treat or prevent infections and inflammation. Antibiotic medicines that are put on the skin or taken as a pill. Pills that lower sebum production. Birth control pills. Light or laser treatments. Medicine injected into areas with acne. Chemicals that cause the skin to peel. Surgery. Your health care provider will also recommend the best way to take care of your skin. Good skin care is the most important part of treatment. Follow these instructions at home: Skin care Take care of your skin as told by your health care provider. You may be told to do these things: Wash your skin gently: At least two times each day. After you exercise and before going to bed. Use mild soap. After you wash your skin, put a water-based lotion on it for moisture. Use a sunscreen or sunblock with SPF 30 or greater, and apply it often. Acne medicines make skin more sensitive to sun. Choose makeup and creams that will not block your oil glands (are noncomedogenic). Medicines Take over-the-counter and prescription medicines only as told by your health care provider. If you were prescribed antibiotics, use them as told by your health care provider. Do not stop using the antibiotic even if your acne improves. General instructions Keep your hair clean and off your face. If you have oily hair, shampoo your hair regularly or daily. Avoid wearing tight headbands or hats. Avoid picking or squeezing your pimples. Picking and squeezing pimples can make acne worse and cause scarring. Shave gently and only when needed. Keep a  food journal to figure out if any foods are linked to your acne. Avoid dairy products, desserts, and chocolates. Take steps to manage and reduce stress. Keep all follow-up visits. Your health care provider needs to watch for changes in  your acne and may need to adjust your treatments. Contact a health care provider if: Your acne is not better after 8 weeks. Your acne gets worse. You have a large area of skin that is red or tender. You think that you are having side effects from any acne medicine. This information is not intended to replace advice given to you by your health care provider. Make sure you discuss any questions you have with your health care provider. Document Revised: 11/27/2021 Document Reviewed: 11/27/2021 Elsevier Patient Education  2024 [employer].

## 2024-03-25 ENCOUNTER — Encounter: Payer: Self-pay | Admitting: Nurse Practitioner

## 2024-03-25 MED ORDER — NORETHIN ACE-ETH ESTRAD-FE 1-20 MG-MCG(24) PO TABS: 1.0000 | ORAL_TABLET | Freq: Every day | ORAL | 2 refills | Status: DC

## 2024-03-25 NOTE — Progress Notes (Signed)
 Subjective:    Patient ID: Victoria Huynh, female    DOB: 2000/10/18, 23 y.o.   MRN: [id number]  HPI Discussed the use of AI scribe software for clinical note transcription with the patient, who gave verbal consent to proceed.  History of Present Illness Victoria Huynh is a 23 year old female who presents for birth control renewal.  She is currently taking Loestrin FE, a low-dose 21 day active birth control pill, which has effectively reduced her menstrual cramps. However, she experiences extreme fatigue during the placebo week, describing it as feeling 'exhausted all the time.' Her menstrual bleeding varies, with some months being light and lasting two days, while other months extend from Tuesday to Saturday, though not heavily.  She has a history of using Lo loestrin as her first birth control pill and is currently on Junel with a 30 mcg dose of estrogen. She has not had any lab work done in the past two to three years.  She has noticed an increase in acne, particularly on her chest, back, and chin, which started around the beginning of the summer. She uses Differin Gel on her chin, which causes peeling, but has not tried it on her chest or back. No excess hair growth and her weight has remained stable since losing weight two years ago.  She vapes nicotine and is not interested in receiving a flu vaccine at this time. No breathing issues, chest pain, shortness of breath, coughing, or wheezing.  Has an appointment for her physical on 05/16/2024.   Review of Systems  Constitutional:  Positive for fatigue.  HENT:  Negative for sore throat and trouble swallowing.   Respiratory:  Negative for cough, chest tightness, shortness of breath and wheezing.   Cardiovascular:  Negative for chest pain.  Genitourinary:  Negative for menstrual problem.  Skin:        Acne face and upper chest and back.       03/24/2024    2:32 PM  Depression screen PHQ 2/9  Decreased Interest 0  Down, Depressed,  Hopeless 0  PHQ - 2 Score 0  Altered sleeping 0  Tired, decreased energy 0  Change in appetite 0  Feeling bad or failure about yourself  0  Trouble concentrating 0  Moving slowly or fidgety/restless 0  Suicidal thoughts 0  PHQ-9 Score 0  Difficult doing work/chores Not difficult at all      03/24/2024    2:33 PM 05/06/2023    9:46 AM  GAD 7 : Generalized Anxiety Score  Nervous, Anxious, on Edge 0 0  Control/stop worrying 0 0  Worry too much - different things 0 0  Trouble relaxing 0 0  Restless 0 0  Easily annoyed or irritable 0 0  Afraid - awful might happen 0 0  Total GAD 7 Score 0 0  Anxiety Difficulty Not difficult at all Not difficult at all   Social History   Tobacco Use   Smoking status: Never   Smokeless tobacco: Never  Vaping Use   Vaping status: Every Day   Substances: Nicotine  Substance Use Topics   Alcohol use: Yes    Comment: 3-4 drinks per week when out with friends   Drug use: Never    Comment: regular long time partner        Objective:   Physical Exam Vitals and nursing note reviewed.  Constitutional:      General: She is not in acute distress. Neck:     Comments: Thyroid  non tender to palpation. No mass or goiter noted.  Cardiovascular:     Rate and Rhythm: Normal rate and regular rhythm.     Heart sounds: Normal heart sounds.  Pulmonary:     Effort: Pulmonary effort is normal.     Breath sounds: Normal breath sounds.  Musculoskeletal:     Cervical back: Neck supple.  Skin:    Comments: Mild pink papular acne noted mainly on upper chest and back with slight outbreak on face. A few scattered fine pustules noted.   Neurological:     Mental Status: She is alert and oriented to person, place, and time.  Psychiatric:        Mood and Affect: Mood normal.        Behavior: Behavior normal.        Thought Content: Thought content normal.        Judgment: Judgment normal.     Comments: Making good eye contact. Calm, cheerful affect. Speech  clear.     Today's Vitals   03/24/24 1428  BP: 116/73  Pulse: 78  Temp: 98.6 F (37 C)  SpO2: 97%  Weight: 160 lb 1 oz (72.6 kg)  Height: 5' 4 (1.626 m)  PainSc: 0-No pain   Body mass index is 27.47 kg/m.        Assessment & Plan:  1. Oral contraceptive pill surveillance (Primary) Significant fatigue likely due to hormone level drop during placebo week of Loestrin FE 1.5/30, which effectively reduces cramps and manages bleeding. - Discussed several option. Trial of Loestrin FE which will provide 3 extra days of estrogen but a lower dose of hormones than current pill. - Contact office if any issues.  - Discussed risks associated with nicotine use.  - Norethindrone  Acetate-Ethinyl Estrad-FE (LOESTRIN 24 FE) 1-20 MG-MCG(24) tablet; Take 1 tablet by mouth daily.  Dispense: 28 tablet; Refill: 2  2. Acne vulgaris Continue oc use. - clindamycin  (CLINDAGEL) 1 % gel; Apply topically 2 (two) times daily. To acne prn  Dispense: 30 g; Refill: 0  3. Fatigue, unspecified type  - CBC with Differential/Platelet - Comprehensive metabolic panel with GFR - TSH  4. Screening for lipid disorders  - Lipid panel  Return for Follow up in November as planned. Call back sooner if needed.

## 2024-05-08 DIAGNOSIS — Z13220 Encounter for screening for lipoid disorders: Secondary | ICD-10-CM | POA: Diagnosis not present

## 2024-05-08 DIAGNOSIS — R5383 Other fatigue: Secondary | ICD-10-CM | POA: Diagnosis not present

## 2024-05-09 LAB — LIPID PANEL
Chol/HDL Ratio: 2.3 ratio (ref 0.0–4.4)
Cholesterol, Total: 142 mg/dL (ref 100–199)
HDL: 61 mg/dL (ref >39)
LDL Chol Calc (NIH): 59 mg/dL (ref 0–99)
Triglycerides: 123 mg/dL (ref 0–149)
VLDL Cholesterol Cal: 22 mg/dL (ref 5–40)

## 2024-05-09 LAB — COMPREHENSIVE METABOLIC PANEL WITH GFR
ALT: 11 IU/L (ref 0–32)
AST: 15 IU/L (ref 0–40)
Albumin: 4.6 g/dL (ref 4.0–5.0)
Alkaline Phosphatase: 39 IU/L — ABNORMAL LOW (ref 41–116)
BUN/Creatinine Ratio: 11 (ref 9–23)
BUN: 9 mg/dL (ref 6–20)
Bilirubin Total: 0.2 mg/dL (ref 0.0–1.2)
CO2: 21 mmol/L (ref 20–29)
Calcium: 9.3 mg/dL (ref 8.7–10.2)
Chloride: 105 mmol/L (ref 96–106)
Creatinine, Ser: 0.84 mg/dL (ref 0.57–1.00)
Globulin, Total: 2.6 g/dL (ref 1.5–4.5)
Glucose: 91 mg/dL (ref 70–99)
Potassium: 3.8 mmol/L (ref 3.5–5.2)
Sodium: 141 mmol/L (ref 134–144)
Total Protein: 7.2 g/dL (ref 6.0–8.5)
eGFR: 99 mL/min/1.73 (ref >59)

## 2024-05-09 LAB — CBC WITH DIFFERENTIAL/PLATELET
Basophils Absolute: 0 x10E3/uL (ref 0.0–0.2)
Basos: 1 %
EOS (ABSOLUTE): 0.1 x10E3/uL (ref 0.0–0.4)
Eos: 1 %
Hematocrit: 42.5 % (ref 34.0–46.6)
Hemoglobin: 14.1 g/dL (ref 11.1–15.9)
Immature Grans (Abs): 0 x10E3/uL (ref 0.0–0.1)
Immature Granulocytes: 0 %
Lymphocytes Absolute: 3.5 x10E3/uL — ABNORMAL HIGH (ref 0.7–3.1)
Lymphs: 41 %
MCH: 29.5 pg (ref 26.6–33.0)
MCHC: 33.2 g/dL (ref 31.5–35.7)
MCV: 89 fL (ref 79–97)
Monocytes Absolute: 0.5 x10E3/uL (ref 0.1–0.9)
Monocytes: 6 %
Neutrophils Absolute: 4.5 x10E3/uL (ref 1.4–7.0)
Neutrophils: 51 %
Platelets: 232 x10E3/uL (ref 150–450)
RBC: 4.78 x10E6/uL (ref 3.77–5.28)
RDW: 12.5 % (ref 11.7–15.4)
WBC: 8.7 x10E3/uL (ref 3.4–10.8)

## 2024-05-09 LAB — TSH: TSH: 2.23 u[IU]/mL (ref 0.450–4.500)

## 2024-05-16 ENCOUNTER — Ambulatory Visit: Admitting: Nurse Practitioner

## 2024-05-16 ENCOUNTER — Encounter: Payer: Self-pay | Admitting: Nurse Practitioner

## 2024-05-16 VITALS — BP 122/82 | Ht 64.0 in | Wt 162.1 lb

## 2024-05-16 DIAGNOSIS — Z01411 Encounter for gynecological examination (general) (routine) with abnormal findings: Secondary | ICD-10-CM

## 2024-05-16 DIAGNOSIS — Z720 Tobacco use: Secondary | ICD-10-CM | POA: Diagnosis not present

## 2024-05-16 DIAGNOSIS — Z01419 Encounter for gynecological examination (general) (routine) without abnormal findings: Secondary | ICD-10-CM

## 2024-05-16 DIAGNOSIS — L74510 Primary focal hyperhidrosis, axilla: Secondary | ICD-10-CM | POA: Diagnosis not present

## 2024-05-16 DIAGNOSIS — Z3041 Encounter for surveillance of contraceptive pills: Secondary | ICD-10-CM

## 2024-05-16 NOTE — Progress Notes (Deleted)
 N

## 2024-05-16 NOTE — Progress Notes (Deleted)
 Subjective:    Patient ID: Victoria Huynh, female    DOB: [birth year], 23 y.o.   MRN: [id number] CC: annual physical  HPI 23 year old, female, arrives for her wellness exam. Patients concern today was sweating of bilateral armpits and sometimes her hands. The axillary of her shift was visibly moist.  A review of their health history was completed.  A review of medications was also completed.  Any needed refills; none  Eating habits: good, mostly cooks and tries to eat out minimally on the weekends  Sleep: 7/8 hours nightly  Menstrual: regular, about 2/3 tampons/pads on heavy days and no symptoms of concern.   Falls/  MVA accidents in past few months: no  Regular exercise: works at the [employer] and stays on her feel constantly  Specialist pt sees on regular basis: none.  Preventative health issues were discussed.   Review of Systems  Constitutional:  Negative for activity change, appetite change and fatigue.  HENT:  Negative for sore throat and trouble swallowing.   Respiratory:  Negative for cough, shortness of breath and wheezing.   Cardiovascular:  Negative for chest pain.  Gastrointestinal:  Negative for abdominal pain, constipation, diarrhea, nausea and vomiting.  Genitourinary:  Negative for dysuria, frequency, urgency, vaginal discharge and vaginal pain.        Objective:   Physical Exam Vitals and nursing note reviewed.  Constitutional:      General: She is not in acute distress.    Appearance: Normal appearance.  Neck:     Comments: Thyroid palpated - soft, nont-ender, no mass/goiter noted Cardiovascular:     Rate and Rhythm: Normal rate and regular rhythm.     Heart sounds: Normal heart sounds.  Pulmonary:     Effort: Pulmonary effort is normal.     Breath sounds: Normal breath sounds.  Musculoskeletal:     Cervical back: No tenderness.  Neurological:     Mental Status: She is alert.   Breasts: breasts appear normal, no suspicious masses, no skin  or nipple changes or axillary nodes.   Vitals:   05/16/24 0920  BP: 122/82  Height: 5' 4 (1.626 m)  Weight: 73.5 kg  BMI (Calculated): 27.81    Results for orders placed or performed in visit on 03/24/24  CBC with Differential/Platelet   Collection Time: 05/08/24  4:15 PM  Result Value Ref Range   WBC 8.7 3.4 - 10.8 x10E3/uL   RBC 4.78 3.77 - 5.28 x10E6/uL   Hemoglobin 14.1 11.1 - 15.9 g/dL   Hematocrit 57.4 65.9 - 46.6 %   MCV 89 79 - 97 fL   MCH 29.5 26.6 - 33.0 pg   MCHC 33.2 31.5 - 35.7 g/dL   RDW 87.4 88.2 - 84.5 %   Platelets 232 150 - 450 x10E3/uL   Neutrophils 51 Not Estab. %   Lymphs 41 Not Estab. %   Monocytes 6 Not Estab. %   Eos 1 Not Estab. %   Basos 1 Not Estab. %   Neutrophils Absolute 4.5 1.4 - 7.0 x10E3/uL   Lymphocytes Absolute 3.5 (H) 0.7 - 3.1 x10E3/uL   Monocytes Absolute 0.5 0.1 - 0.9 x10E3/uL   EOS (ABSOLUTE) 0.1 0.0 - 0.4 x10E3/uL   Basophils Absolute 0.0 0.0 - 0.2 x10E3/uL   Immature Granulocytes 0 Not Estab. %   Immature Grans (Abs) 0.0 0.0 - 0.1 x10E3/uL  Comprehensive metabolic panel with GFR   Collection Time: 05/08/24  4:15 PM  Result Value Ref Range  Glucose 91 70 - 99 mg/dL   BUN 9 6 - 20 mg/dL   Creatinine, Ser 9.15 0.57 - 1.00 mg/dL   eGFR 99 >40 fO/fpw/8.26   BUN/Creatinine Ratio 11 9 - 23   Sodium 141 134 - 144 mmol/L   Potassium 3.8 3.5 - 5.2 mmol/L   Chloride 105 96 - 106 mmol/L   CO2 21 20 - 29 mmol/L   Calcium 9.3 8.7 - 10.2 mg/dL   Total Protein 7.2 6.0 - 8.5 g/dL   Albumin 4.6 4.0 - 5.0 g/dL   Globulin, Total 2.6 1.5 - 4.5 g/dL   Bilirubin Total 0.2 0.0 - 1.2 mg/dL   Alkaline Phosphatase 39 (L) 41 - 116 IU/L   AST 15 0 - 40 IU/L   ALT 11 0 - 32 IU/L  Lipid panel   Collection Time: 05/08/24  4:15 PM  Result Value Ref Range   Cholesterol, Total 142 100 - 199 mg/dL   Triglycerides 876 0 - 149 mg/dL   HDL 61 >60 mg/dL   VLDL Cholesterol Cal 22 5 - 40 mg/dL   LDL Chol Calc (NIH) 59 0 - 99 mg/dL   Chol/HDL Ratio 2.3  0.0 - 4.4 ratio  TSH   Collection Time: 05/08/24  4:15 PM  Result Value Ref Range   TSH 2.230 0.450 - 4.500 uIU/mL        Assessment & Plan:  1. Well woman exam (Primary) Labs reviewed. Patient educated on participating in physical activity 3 days a week for at least 30 minutes and eating a healthy diet. Smoking education, given.   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  If sweating persist, contact the office for additional options.  Additional options discussed.   Return in about 1 year (around 05/16/2025).

## 2024-05-20 ENCOUNTER — Encounter: Payer: Self-pay | Admitting: Nurse Practitioner

## 2024-05-20 DIAGNOSIS — Z720 Tobacco use: Secondary | ICD-10-CM | POA: Insufficient documentation

## 2024-05-20 DIAGNOSIS — L74510 Primary focal hyperhidrosis, axilla: Secondary | ICD-10-CM | POA: Insufficient documentation

## 2024-05-20 NOTE — Progress Notes (Signed)
 Subjective:    Patient ID: Victoria Huynh, female    DOB: 01/11/2001, 23 y.o.   MRN: [id number]  HPI The patient comes in today for a wellness visit.    A review of their health history was completed.  A review of medications was also completed.  Any needed refills; oral contraceptives  Eating habits: described as good  Falls/  MVA accidents in past few months: none  Regular exercise: very active job requiring walking constantly at work   Specialist pt sees on regular basis: none  Preventative health issues were discussed.   Additional concerns: excessive sweating in her underarm area on regular deodorant.   Regular menses, normal flow. Doing well on current oc. Menorrhagia and dysmenorrhea well controlled. Same female sexual partner. Defers STI testing. PAP smear 2023 normal.  Regular dental care.   Review of Systems  Constitutional:  Negative for activity change, appetite change and fatigue.  HENT:  Negative for sore throat and trouble swallowing.   Respiratory:  Negative for cough, chest tightness, shortness of breath and wheezing.   Cardiovascular:  Negative for chest pain.  Gastrointestinal:  Negative for abdominal distention, abdominal pain, constipation, diarrhea, nausea and vomiting.  Genitourinary:  Negative for difficulty urinating, dysuria, frequency, genital sores, menstrual problem, pelvic pain, urgency and vaginal discharge.   Social History   Tobacco Use   Smoking status: Never   Smokeless tobacco: Never  Vaping Use   Vaping status: Every Day   Substances: Nicotine  Substance Use Topics   Alcohol use: Yes    Comment: 3-4 drinks per week when out with friends   Drug use: Never    Comment: regular long time partner      05/16/2024    9:21 AM  Depression screen PHQ 2/9  Decreased Interest 0  Down, Depressed, Hopeless 0  PHQ - 2 Score 0  Altered sleeping 0  Tired, decreased energy 0  Change in appetite 0  Feeling bad or failure about yourself   0  Trouble concentrating 0  Moving slowly or fidgety/restless 0  Suicidal thoughts 0  PHQ-9 Score 0      03/24/2024    2:33 PM 05/06/2023    9:46 AM  GAD 7 : Generalized Anxiety Score  Nervous, Anxious, on Edge 0 0  Control/stop worrying 0 0  Worry too much - different things 0 0  Trouble relaxing 0 0  Restless 0 0  Easily annoyed or irritable 0 0  Afraid - awful might happen 0 0  Total GAD 7 Score 0 0  Anxiety Difficulty Not difficult at all Not difficult at all         Objective:   Physical Exam Vitals and nursing note reviewed. Exam conducted with a chaperone present.  Constitutional:      General: She is not in acute distress.    Appearance: She is well-developed.  Neck:     Thyroid: No thyromegaly.     Trachea: No tracheal deviation.     Comments: Thyroid non tender to palpation. No mass or goiter noted.  Cardiovascular:     Rate and Rhythm: Normal rate and regular rhythm.     Heart sounds: Normal heart sounds. No murmur heard. Pulmonary:     Effort: Pulmonary effort is normal.     Breath sounds: Normal breath sounds.  Chest:  Breasts:    Right: No swelling, inverted nipple, mass, skin change or tenderness.     Left: No swelling, inverted nipple, mass, skin change  or tenderness.  Abdominal:     General: There is no distension.     Palpations: Abdomen is soft.     Tenderness: There is no abdominal tenderness.  Musculoskeletal:     Cervical back: Normal range of motion and neck supple.  Lymphadenopathy:     Cervical: No cervical adenopathy.     Upper Body:     Right upper body: No supraclavicular, axillary or pectoral adenopathy.     Left upper body: No supraclavicular, axillary or pectoral adenopathy.  Skin:    General: Skin is warm and dry.     Findings: No rash.  Neurological:     Mental Status: She is alert and oriented to person, place, and time.  Psychiatric:        Mood and Affect: Mood normal.        Behavior: Behavior normal.        Thought  Content: Thought content normal.        Judgment: Judgment normal.    Today's Vitals   05/16/24 0920  BP: 122/82  Weight: 162 lb 1.3 oz (73.5 kg)  Height: 5' 4 (1.626 m)   Body mass index is 27.82 kg/m.  Results for orders placed or performed in visit on 03/24/24  CBC with Differential/Platelet   Collection Time: 05/08/24  4:15 PM  Result Value Ref Range   WBC 8.7 3.4 - 10.8 x10E3/uL   RBC 4.78 3.77 - 5.28 x10E6/uL   Hemoglobin 14.1 11.1 - 15.9 g/dL   Hematocrit 57.4 65.9 - 46.6 %   MCV 89 79 - 97 fL   MCH 29.5 26.6 - 33.0 pg   MCHC 33.2 31.5 - 35.7 g/dL   RDW 87.4 88.2 - 84.5 %   Platelets 232 150 - 450 x10E3/uL   Neutrophils 51 Not Estab. %   Lymphs 41 Not Estab. %   Monocytes 6 Not Estab. %   Eos 1 Not Estab. %   Basos 1 Not Estab. %   Neutrophils Absolute 4.5 1.4 - 7.0 x10E3/uL   Lymphocytes Absolute 3.5 (H) 0.7 - 3.1 x10E3/uL   Monocytes Absolute 0.5 0.1 - 0.9 x10E3/uL   EOS (ABSOLUTE) 0.1 0.0 - 0.4 x10E3/uL   Basophils Absolute 0.0 0.0 - 0.2 x10E3/uL   Immature Granulocytes 0 Not Estab. %   Immature Grans (Abs) 0.0 0.0 - 0.1 x10E3/uL  Comprehensive metabolic panel with GFR   Collection Time: 05/08/24  4:15 PM  Result Value Ref Range   Glucose 91 70 - 99 mg/dL   BUN 9 6 - 20 mg/dL   Creatinine, Ser 9.15 0.57 - 1.00 mg/dL   eGFR 99 >40 fO/fpw/8.26   BUN/Creatinine Ratio 11 9 - 23   Sodium 141 134 - 144 mmol/L   Potassium 3.8 3.5 - 5.2 mmol/L   Chloride 105 96 - 106 mmol/L   CO2 21 20 - 29 mmol/L   Calcium 9.3 8.7 - 10.2 mg/dL   Total Protein 7.2 6.0 - 8.5 g/dL   Albumin 4.6 4.0 - 5.0 g/dL   Globulin, Total 2.6 1.5 - 4.5 g/dL   Bilirubin Total 0.2 0.0 - 1.2 mg/dL   Alkaline Phosphatase 39 (L) 41 - 116 IU/L   AST 15 0 - 40 IU/L   ALT 11 0 - 32 IU/L  Lipid panel   Collection Time: 05/08/24  4:15 PM  Result Value Ref Range   Cholesterol, Total 142 100 - 199 mg/dL   Triglycerides 876 0 - 149 mg/dL   HDL 61 >  39 mg/dL   VLDL Cholesterol Cal 22 5 - 40  mg/dL   LDL Chol Calc (NIH) 59 0 - 99 mg/dL   Chol/HDL Ratio 2.3 0.0 - 4.4 ratio  TSH   Collection Time: 05/08/24  4:15 PM  Result Value Ref Range   TSH 2.230 0.450 - 4.500 uIU/mL   Reviewed labs with patient during visit.       Assessment & Plan:  1. Well woman exam (Primary) Adult wellness-complete.wellness physical was conducted today. Importance of diet and exercise were discussed in detail.  Importance of stress reduction and healthy living were discussed. We also reviewed over immunizations and gave recommendations regarding current immunization needed for age.    2. Hyperhidrosis of axilla Discussed trial of prescription strength OTC deodorant. If no improvement, contact office so we can consider Drysol.   3. Current every day nicotine vaping Discussed risks with nicotine use. Encouraged decreasing or stopping vaping.   4. Oral contraceptive pill surveillance  - Norethindrone  Acetate-Ethinyl Estrad-FE (HAILEY 24 FE) 1-20 MG-MCG(24) tablet; Take 1 tablet by mouth daily.  Return in about 1 year (around 05/16/2025).

## 2024-07-05 ENCOUNTER — Other Ambulatory Visit: Payer: Self-pay | Admitting: Nurse Practitioner

## 2024-07-05 DIAGNOSIS — Z3041 Encounter for surveillance of contraceptive pills: Secondary | ICD-10-CM
